# Patient Record
Sex: Male | Born: 1955 | Race: White | Hispanic: No | Marital: Married | Smoking: Former smoker
Health system: Southern US, Community
[De-identification: ages and names within clinical notes are randomized; demographics above are authoritative.]

## PROBLEM LIST (undated history)

## (undated) DIAGNOSIS — E119 Type 2 diabetes mellitus without complications: Secondary | ICD-10-CM

## (undated) DIAGNOSIS — F411 Generalized anxiety disorder: Secondary | ICD-10-CM

## (undated) DIAGNOSIS — E782 Mixed hyperlipidemia: Secondary | ICD-10-CM

## (undated) DIAGNOSIS — K219 Gastro-esophageal reflux disease without esophagitis: Secondary | ICD-10-CM

## (undated) DIAGNOSIS — B1920 Unspecified viral hepatitis C without hepatic coma: Secondary | ICD-10-CM

## (undated) DIAGNOSIS — J302 Other seasonal allergic rhinitis: Secondary | ICD-10-CM

## (undated) DIAGNOSIS — L959 Vasculitis limited to the skin, unspecified: Secondary | ICD-10-CM

## (undated) DIAGNOSIS — I776 Arteritis, unspecified: Secondary | ICD-10-CM

## (undated) DIAGNOSIS — E291 Testicular hypofunction: Secondary | ICD-10-CM

## (undated) HISTORY — DX: Arteritis, unspecified: I77.6

## (undated) HISTORY — DX: Other seasonal allergic rhinitis: J30.2

## (undated) HISTORY — DX: Generalized anxiety disorder: F41.1

## (undated) HISTORY — DX: Unspecified viral hepatitis C without hepatic coma: B19.20

## (undated) HISTORY — PX: LIVER BIOPSY: SHX301

## (undated) HISTORY — DX: Type 2 diabetes mellitus without complications: E11.9

## (undated) HISTORY — DX: Mixed hyperlipidemia: E78.2

## (undated) HISTORY — DX: Gastro-esophageal reflux disease without esophagitis: K21.9

## (undated) HISTORY — PX: ESOPHAGOGASTRODUODENOSCOPY: SHX1529

## (undated) HISTORY — DX: Testicular hypofunction: E29.1

## (undated) HISTORY — DX: Vasculitis limited to the skin, unspecified: L95.9

---

## 2007-05-12 ENCOUNTER — Ambulatory Visit: Payer: Self-pay | Admitting: Gastroenterology

## 2008-07-30 ENCOUNTER — Ambulatory Visit: Payer: Self-pay | Admitting: Family Medicine

## 2010-10-16 ENCOUNTER — Ambulatory Visit: Payer: Self-pay | Admitting: Gastroenterology

## 2011-04-09 ENCOUNTER — Ambulatory Visit: Payer: Self-pay | Admitting: Gastroenterology

## 2011-04-19 LAB — PATHOLOGY REPORT

## 2011-10-18 ENCOUNTER — Ambulatory Visit: Payer: Self-pay

## 2011-10-18 ENCOUNTER — Emergency Department: Payer: Self-pay | Admitting: Emergency Medicine

## 2012-11-04 IMAGING — US ULTRASOUND CORE BIOPSY
1 series · 6 of 6 positions shown · non-contrast
Comparison: none

REASON FOR EXAM: liver biopsy  hepatitis C
COMMENTS:

[Series 1: ultrasound core biopsy · 6 of 6 slices shown]
[im 1/6]
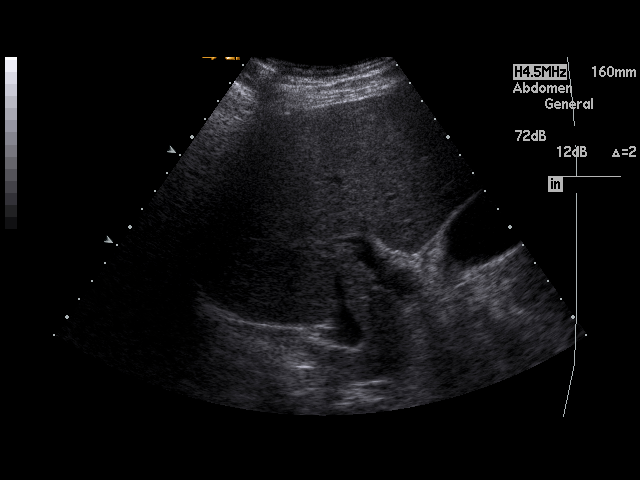
[im 2/6]
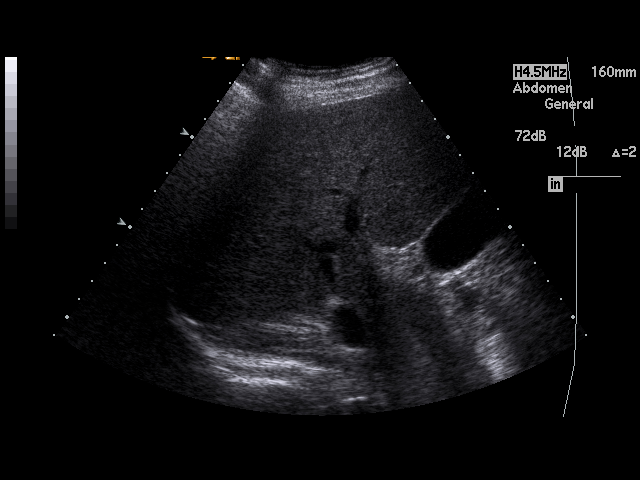
[im 3/6]
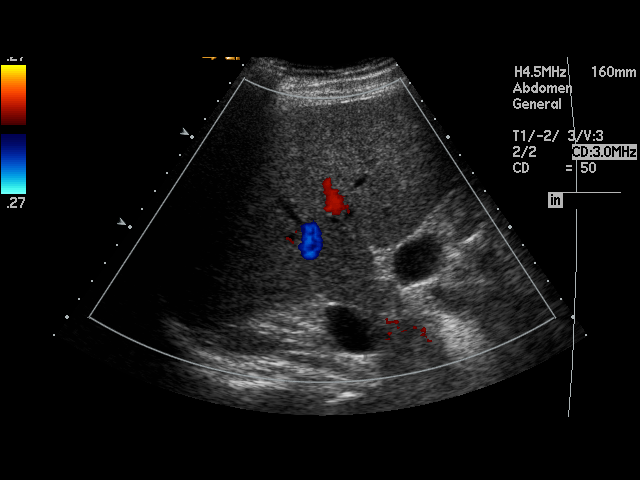
[im 4/6]
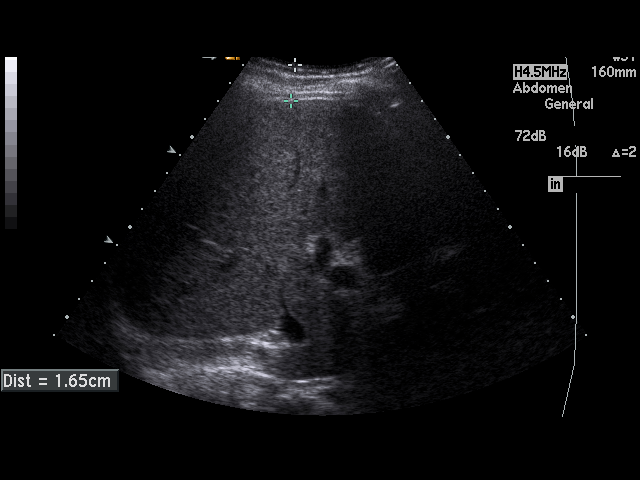
[im 5/6]
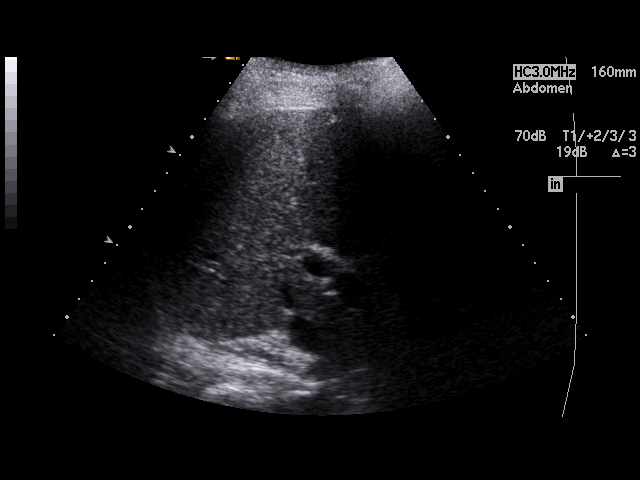
[im 6/6]
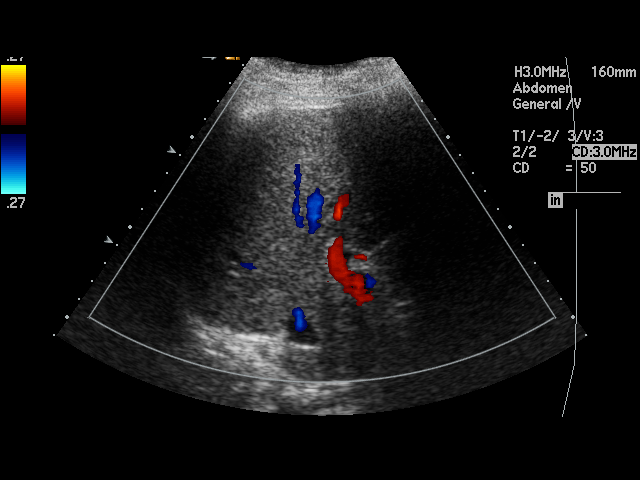

[6 of 6 positions shown; findings below may reference images not displayed]

PROCEDURE:     US  - US GUIDED BX/ASPIRATION NOT BR  - April 09, 2011  [DATE]

RESULT:     Indication: Hepatitis C.  Liver biopsy is requested.

Procedure:  Clinical assessment was performed and informed consent obtained.
A formal timeout procedure was performed according to departmental protocol.
The patient was brought to the sonography suite and positioned supine.  The
inferior margin of the liver at the midaxillary line was identified under
ultrasound and marked for biopsy. The site was prepped and draped in the
usual sterile manner.

Following adequate local anesthesia with 7 ml of 1% lidocaine, one 18 gauge
core needle Biopsy of the right hepatic lobe were performed under
sonographic guidance in end-expiration and submitted for pathology.  The
procedure was well tolerated and without complication.  The patient returned
to the recovery unit in stable condition in the right lateral decubitus
position.
IMPRESSION: Uncomplicated core needle liver biopsy.

## 2013-01-26 ENCOUNTER — Ambulatory Visit (HOSPITAL_COMMUNITY): Payer: Self-pay | Admitting: Psychiatry

## 2013-06-22 ENCOUNTER — Ambulatory Visit: Payer: Self-pay | Admitting: Urology

## 2021-10-08 ENCOUNTER — Other Ambulatory Visit: Payer: Self-pay | Admitting: Internal Medicine

## 2021-10-08 DIAGNOSIS — R1013 Epigastric pain: Secondary | ICD-10-CM

## 2021-10-08 DIAGNOSIS — R1112 Projectile vomiting: Secondary | ICD-10-CM

## 2021-10-14 ENCOUNTER — Ambulatory Visit
Admission: RE | Admit: 2021-10-14 | Discharge: 2021-10-14 | Disposition: A | Discharge status: Home / Self Care | Payer: Medicare PPO | Source: Ambulatory Visit | Attending: Internal Medicine | Admitting: Internal Medicine

## 2021-10-14 ENCOUNTER — Other Ambulatory Visit: Payer: Self-pay

## 2021-10-14 DIAGNOSIS — R1013 Epigastric pain: Secondary | ICD-10-CM | POA: Diagnosis present

## 2021-10-14 DIAGNOSIS — R1112 Projectile vomiting: Secondary | ICD-10-CM | POA: Diagnosis present

## 2021-11-02 ENCOUNTER — Ambulatory Visit: Admit: 2021-11-02 | Payer: Self-pay | Admitting: Internal Medicine

## 2021-11-02 SURGERY — EGD (ESOPHAGOGASTRODUODENOSCOPY)
Anesthesia: General

## 2022-11-17 ENCOUNTER — Other Ambulatory Visit: Payer: Self-pay | Admitting: Family Medicine

## 2022-11-17 DIAGNOSIS — E782 Mixed hyperlipidemia: Secondary | ICD-10-CM

## 2022-12-01 ENCOUNTER — Ambulatory Visit: Payer: Medicare PPO

## 2022-12-10 ENCOUNTER — Telehealth: Payer: Self-pay

## 2022-12-10 NOTE — Telephone Encounter (Signed)
Multiple calls made to patient and his  spouse for introductory phone call prior to his new patient appointment on 12/13/22 with Dr. Tasia Catchings. I received no answer or voicemail on multiple attempts.

## 2022-12-13 ENCOUNTER — Inpatient Hospital Stay: Payer: Medicare PPO

## 2022-12-13 ENCOUNTER — Inpatient Hospital Stay: Payer: Medicare PPO | Attending: Oncology | Admitting: Oncology

## 2022-12-13 ENCOUNTER — Encounter: Payer: Self-pay | Admitting: Oncology

## 2022-12-13 VITALS — BP 144/91 | HR 78 | Temp 97.2°F | Resp 18 | Ht 65.0 in | Wt 151.0 lb

## 2022-12-13 DIAGNOSIS — D751 Secondary polycythemia: Secondary | ICD-10-CM | POA: Insufficient documentation

## 2022-12-13 DIAGNOSIS — Z79899 Other long term (current) drug therapy: Secondary | ICD-10-CM | POA: Diagnosis not present

## 2022-12-13 DIAGNOSIS — Z803 Family history of malignant neoplasm of breast: Secondary | ICD-10-CM | POA: Insufficient documentation

## 2022-12-13 DIAGNOSIS — Z8051 Family history of malignant neoplasm of kidney: Secondary | ICD-10-CM | POA: Diagnosis not present

## 2022-12-13 DIAGNOSIS — Z8042 Family history of malignant neoplasm of prostate: Secondary | ICD-10-CM | POA: Insufficient documentation

## 2022-12-13 DIAGNOSIS — Z87891 Personal history of nicotine dependence: Secondary | ICD-10-CM | POA: Diagnosis not present

## 2022-12-13 LAB — CBC WITH DIFFERENTIAL/PLATELET
Abs Immature Granulocytes: 0.02 10*3/uL (ref 0.00–0.07)
Basophils Absolute: 0.1 10*3/uL (ref 0.0–0.1)
Basophils Relative: 1 %
Eosinophils Absolute: 0.2 10*3/uL (ref 0.0–0.5)
Eosinophils Relative: 2 %
HCT: 51.5 % (ref 39.0–52.0)
Hemoglobin: 16.9 g/dL (ref 13.0–17.0)
Immature Granulocytes: 0 %
Lymphocytes Relative: 15 %
Lymphs Abs: 1.2 10*3/uL (ref 0.7–4.0)
MCH: 29.1 pg (ref 26.0–34.0)
MCHC: 32.8 g/dL (ref 30.0–36.0)
MCV: 88.8 fL (ref 80.0–100.0)
Monocytes Absolute: 0.7 10*3/uL (ref 0.1–1.0)
Monocytes Relative: 9 %
Neutro Abs: 5.9 10*3/uL (ref 1.7–7.7)
Neutrophils Relative %: 73 %
Platelets: 168 10*3/uL (ref 150–400)
RBC: 5.8 MIL/uL (ref 4.22–5.81)
RDW: 12.7 % (ref 11.5–15.5)
WBC: 8.1 10*3/uL (ref 4.0–10.5)
nRBC: 0 % (ref 0.0–0.2)

## 2022-12-13 NOTE — Progress Notes (Signed)
Hematology/Oncology Consult note [phone] [phone]        REFERRING PROVIDER: Lonia Farber, MD   CHIEF COMPLAINTS/REASON FOR VISIT:  Evaluation of erythrocytosis   ASSESSMENT & PLAN:   Erythrocytosis Labs are reviewed and discussed with patient. Erythrocytosis is an abnormal elevation of hemoglobin (Hgb) and/or hematocrit (Hct) in peripheral blood, and this can be caused by primary etiology, ie myeloproliferative disease, or secondary etiology, ie testosterone use, sleep apnea, smoking, etc  or familiar condition.  Patient does not smoke.  Previous workup showed negative for sleep apnea.  Most likely secondary to testosterone replacement therapy. I will obtain erythropoietin, carbo monoxide level, rule out primary etiology, JAK2 with reflex to other mutations, BCR-ABL1 FISH.    Today CBC showed normal hemoglobin and hematocrit.   Orders Placed This Encounter  Procedures   CBC with Differential/Platelet    Standing Status:   Future    Number of Occurrences:   1    Standing Expiration Date:   12/13/2023   JAK2 V617F rfx CALR/MPL/E12-15    Standing Status:   Future    Number of Occurrences:   1    Standing Expiration Date:   12/13/2023   BCR-ABL1 FISH    Standing Status:   Future    Number of Occurrences:   1    Standing Expiration Date:   12/13/2023   Follow-up to be determined.  Depending on workup results. All questions were answered. The patient knows to call the clinic with any problems, questions or concerns.  Earlie Server, MD, PhD [facility] Hematology Oncology 12/13/2022   HISTORY OF PRESENTING ILLNESS:   Eddie Welch is a  67 y.o.  male with PMH listed below was seen in consultation at the request of  Lonia Farber, MD  for evaluation of erythrocytosis.  11/15/2022, patient CBC done which showed a hemoglobin of 18.2, hematocrit 53.6.  Patient is on testosterone replacement therapy, managed by endocrinology Dr. Honor Junes.  With  holding testosterone, patient had a repeat CBC done on 11/22/2022, and had a CBC of 16.7, hematocrit 50.5. Patient reports having being on testosterone for a long time.  He had sleep study done in the past and he does not have sleep apnea.  Denies any unintentional weight loss, night sweats, fever.  Patient is a former smoker.  MEDICAL HISTORY:  Past Medical History:  Diagnosis Date   DM2 (diabetes mellitus, type 2) (HCC)    Generalized anxiety disorder    GERD (gastroesophageal reflux disease)    Hepatitis C    Hypogonadism male    Mixed hyperlipidemia    Seasonal allergies    Vasculitis on skin biopsy ([place])     SURGICAL HISTORY: Past Surgical History:  Procedure Laterality Date   ESOPHAGOGASTRODUODENOSCOPY     LIVER BIOPSY      SOCIAL HISTORY: Social History   Socioeconomic History   Marital status: Married    Spouse name: Not on file   Number of children: Not on file   Years of education: Not on file   Highest education level: Not on file  Occupational History   Not on file  Tobacco Use   Smoking status: Former    Packs/day: 1.00    Years: 15.00    Additional pack years: 0.00    Total pack years: 15.00    Types: Cigarettes    Quit date: 2019    Years since quitting: 5.2   Smokeless tobacco: Never  Vaping Use   Vaping  Use: Never used  Substance and Sexual Activity   Alcohol use: Not Currently    Comment: once in a while   Drug use: Not on file   Sexual activity: Not on file  Other Topics Concern   Not on file  Social History Narrative   Not on file   Social Determinants of Health   Financial Resource Strain: Not on file  Food Insecurity: No Food Insecurity (12/13/2022)   Hunger Vital Sign    Worried About Running Out of Food in the Last Year: Never true    Ran Out of Food in the Last Year: Never true  Transportation Needs: No Transportation Needs (12/13/2022)   PRAPARE - Hydrologist (Medical): No    Lack of Transportation  (Non-Medical): No  Physical Activity: Not on file  Stress: Not on file  Social Connections: Not on file  Intimate Partner Violence: Not At Risk (12/13/2022)   Humiliation, Afraid, Rape, and Kick questionnaire    Fear of Current or Ex-Partner: No    Emotionally Abused: No    Physically Abused: No    Sexually Abused: No    FAMILY HISTORY: Family History  Problem Relation Age of Onset   Hypertension Mother    Breast cancer Mother    Diabetes Father    CAD Father    Heart attack Father    Thyroid disease Father    Cancer Brother    Bipolar disorder Brother    Alcohol abuse Brother    Stroke Maternal Grandfather    Diabetes Paternal Grandmother    Prostate cancer Paternal Grandfather    Kidney cancer Paternal Grandfather     ALLERGIES:  has no allergies on file.  MEDICATIONS:  Current Outpatient Medications  Medication Sig Dispense Refill   buPROPion (WELLBUTRIN XL) 150 MG 24 hr tablet Take 150 mg by mouth daily.     Dulaglutide 3 MG/0.5ML SOPN Inject into the skin. Inject 0.5 mLs (3 mg total) subcutaneously once a week     empagliflozin (JARDIANCE) 25 MG TABS tablet Take 25 mg by mouth daily.     Insulin Aspart FlexPen (NOVOLOG) 100 UNIT/ML 3 units with meals that have carb. Add sliding scale as necessary. Max daily dose 50 units.     lisinopril (ZESTRIL) 5 MG tablet Take 5 mg by mouth daily.     metFORMIN (GLUCOPHAGE) 1000 MG tablet Take 1,000 mg by mouth 2 (two) times daily with a meal.     Multiple Vitamin (MULTIVITAMIN) capsule Take 1 capsule by mouth daily.     pantoprazole (PROTONIX) 40 MG tablet Take 40 mg by mouth daily.     rosuvastatin (CRESTOR) 20 MG tablet Take by mouth. Take 1 tablet (20 mg total) by mouth once daily     sildenafil (REVATIO) 20 MG tablet MAY USE 1 TO 5 TABLETS DAILY 1/2 AN HOURBEFORE INTERCOURSE     sitaGLIPtin (JANUVIA) 100 MG tablet Take 100 mg by mouth daily.     testosterone cypionate (DEPOTESTOSTERONE CYPIONATE) 200 MG/ML injection INJECT  0.5 ML INTO THE MUSCLE EVERY 7 DAYS (SINGLE USE ONLY VIAL)     TRESIBA FLEXTOUCH 100 UNIT/ML FlexTouch Pen INJECT 22 UNITS UNDER THE SKIN EVERY MORNING; TITRATE TO GET FASTING SUGAR BELOW 130 AS DIRECTED (MAX DAILY DOSE: 50 UNITS)     No current facility-administered medications for this visit.    Review of Systems  Constitutional:  Negative for appetite change, chills, fatigue, fever and unexpected weight change.  HENT:  Negative for hearing loss and voice change.   Eyes:  Negative for eye problems and icterus.  Respiratory:  Negative for chest tightness, cough and shortness of breath.   Cardiovascular:  Negative for chest pain and leg swelling.  Gastrointestinal:  Negative for abdominal distention and abdominal pain.  Endocrine: Negative for hot flashes.  Genitourinary:  Negative for difficulty urinating, dysuria and frequency.   Musculoskeletal:  Negative for arthralgias.  Skin:  Negative for itching and rash.  Neurological:  Negative for light-headedness and numbness.  Hematological:  Negative for adenopathy. Does not bruise/bleed easily.  Psychiatric/Behavioral:  Negative for confusion.    PHYSICAL EXAMINATION: ECOG PERFORMANCE STATUS: 0 - Asymptomatic Vitals:   12/13/22 0940  BP: (!) 144/91  Pulse: 78  Resp: 18  Temp: (!) 97.2 F (36.2 C)   Filed Weights   12/13/22 0940  Weight: 151 lb (68.5 kg)    Physical Exam Constitutional:      General: He is not in acute distress. HENT:     Head: Normocephalic and atraumatic.  Eyes:     General: No scleral icterus. Cardiovascular:     Rate and Rhythm: Normal rate and regular rhythm.     Heart sounds: Normal heart sounds.  Pulmonary:     Effort: Pulmonary effort is normal. No respiratory distress.     Breath sounds: No wheezing.  Abdominal:     General: Bowel sounds are normal. There is no distension.     Palpations: Abdomen is soft.  Musculoskeletal:        General: No deformity. Normal range of motion.     Cervical  back: Normal range of motion and neck supple.  Skin:    General: Skin is warm and dry.     Findings: No erythema or rash.  Neurological:     Mental Status: He is alert and oriented to person, place, and time. Mental status is at baseline.     Cranial Nerves: No cranial nerve deficit.     Coordination: Coordination normal.  Psychiatric:        Mood and Affect: Mood normal.     LABORATORY DATA:  I have reviewed the data as listed    Latest Ref Rng & Units 12/13/2022   10:07 AM  CBC  WBC 4.0 - 10.5 K/uL 8.1   Hemoglobin 13.0 - 17.0 g/dL 16.9   Hematocrit 39.0 - 52.0 % 51.5   Platelets 150 - 400 K/uL 168        No data to display            RADIOGRAPHIC STUDIES: I have personally reviewed the radiological images as listed and agreed with the findings in the report. No results found.

## 2022-12-13 NOTE — Progress Notes (Signed)
Pt here to establish care for polycythemia.  

## 2022-12-13 NOTE — Assessment & Plan Note (Addendum)
Labs are reviewed and discussed with patient. Erythrocytosis is an abnormal elevation of hemoglobin (Hgb) and/or hematocrit (Hct) in peripheral blood, and this can be caused by primary etiology, ie myeloproliferative disease, or secondary etiology, ie testosterone use, sleep apnea, smoking, etc  or familiar condition.  Patient does not smoke.  Previous workup showed negative for sleep apnea.  Most likely secondary to testosterone replacement therapy. I will obtain erythropoietin, carbo monoxide level, rule out primary etiology, JAK2 with reflex to other mutations, BCR-ABL1 FISH.    Today CBC showed normal hemoglobin and hematocrit.-No need for urgent phlebotomy at this point.

## 2022-12-15 ENCOUNTER — Ambulatory Visit
Admission: RE | Admit: 2022-12-15 | Discharge: 2022-12-15 | Disposition: A | Discharge status: Home / Self Care | Payer: Medicare PPO | Source: Ambulatory Visit | Attending: Family Medicine | Admitting: Family Medicine

## 2022-12-15 DIAGNOSIS — E782 Mixed hyperlipidemia: Secondary | ICD-10-CM | POA: Diagnosis present

## 2022-12-15 DIAGNOSIS — Z136 Encounter for screening for cardiovascular disorders: Secondary | ICD-10-CM | POA: Diagnosis not present

## 2022-12-17 LAB — BCR-ABL1 FISH
Cells Analyzed: 200
Cells Counted: 200

## 2022-12-22 LAB — JAK2 V617F RFX CALR/MPL/E12-15

## 2022-12-22 LAB — CALR +MPL + E12-E15  (REFLEX)

## 2023-01-03 ENCOUNTER — Telehealth: Payer: Self-pay

## 2023-01-03 DIAGNOSIS — D751 Secondary polycythemia: Secondary | ICD-10-CM

## 2023-01-03 NOTE — Telephone Encounter (Signed)
Pt informed of MD recommendation and follow up plan:   Please schedule and inform pt of appt:  Lab/MD/ Phleb (cbc) in 6 months

## 2023-01-03 NOTE — Telephone Encounter (Signed)
-----   Message from Rickard Patience, MD sent at 01/01/2023  5:20 PM EDT ----- Please let patient know there his blood work up results are good.  Previous high blood count is likely due to testosterone use. Current his blood level is normal. No need to remove blood currently.  If he plans to stay on testosterone replacement, I recommend him to follow up in 6 months lab MD- phleb. Please order cbc thanks.

## 2023-01-12 ENCOUNTER — Encounter: Payer: Self-pay | Admitting: Family Medicine

## 2023-04-12 ENCOUNTER — Ambulatory Visit: Payer: Medicare PPO

## 2023-04-12 DIAGNOSIS — K640 First degree hemorrhoids: Secondary | ICD-10-CM

## 2023-04-12 DIAGNOSIS — D128 Benign neoplasm of rectum: Secondary | ICD-10-CM

## 2023-04-12 DIAGNOSIS — Z1211 Encounter for screening for malignant neoplasm of colon: Secondary | ICD-10-CM

## 2023-05-12 IMAGING — US US ABDOMEN LIMITED
1 series · 14 of 25 positions shown · non-contrast
Comparison: Right upper quadrant abdominal ultrasound 10/16/2010

CLINICAL DATA: Epigastric pain, nausea, and vomiting for 1 month.

EXAM:
ULTRASOUND ABDOMEN LIMITED RIGHT UPPER QUADRANT

[Series 1: us abdomen limited · 0.22mm/px · 14 of 52 slices shown]
[im 1/52]
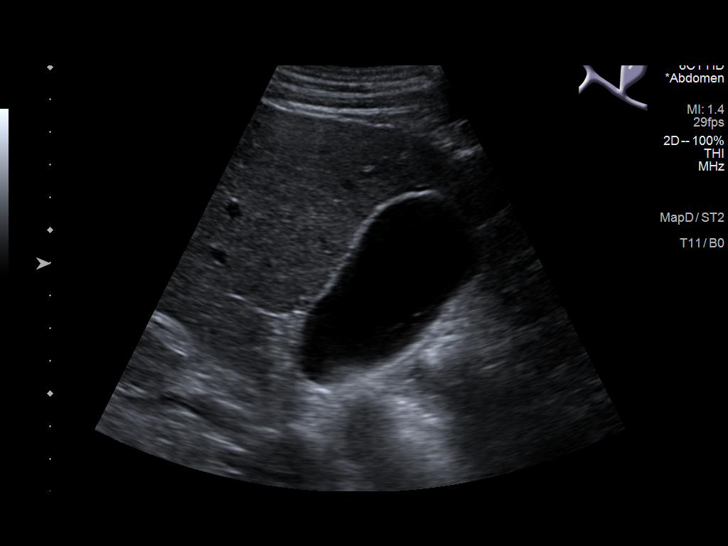
[im 5/52]
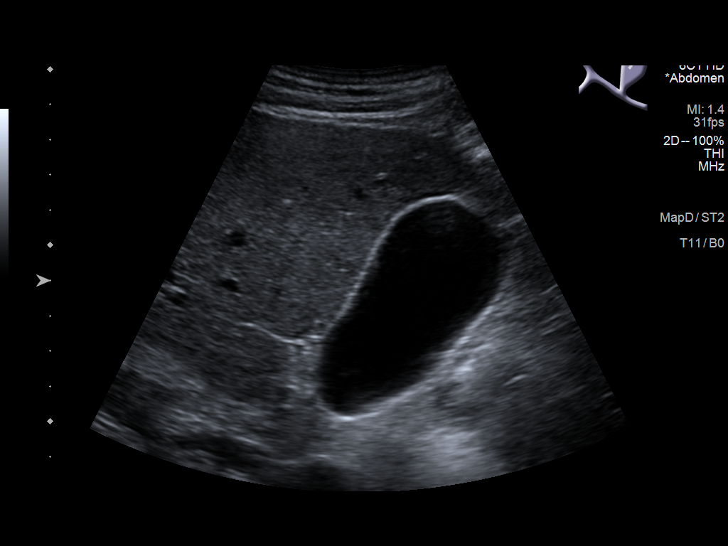
[im 9/52]
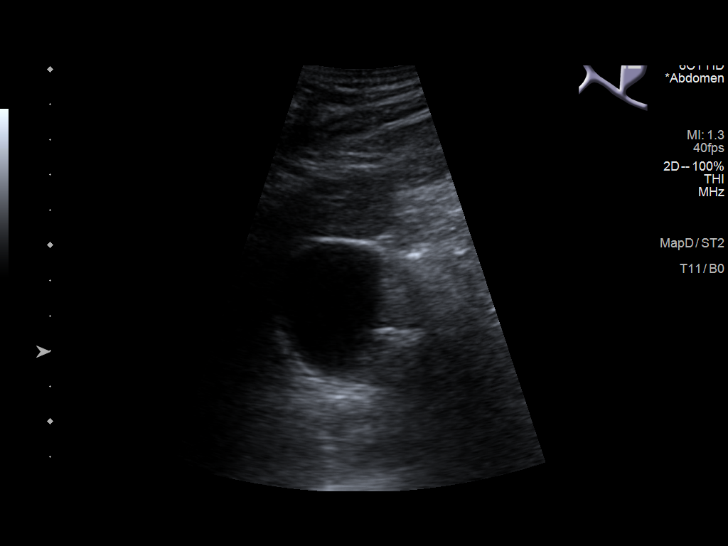
[im 13/52]
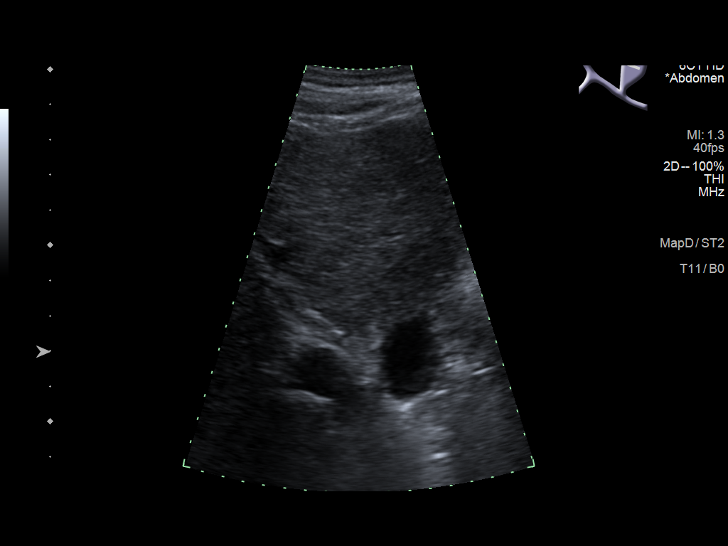
[im 18/52]
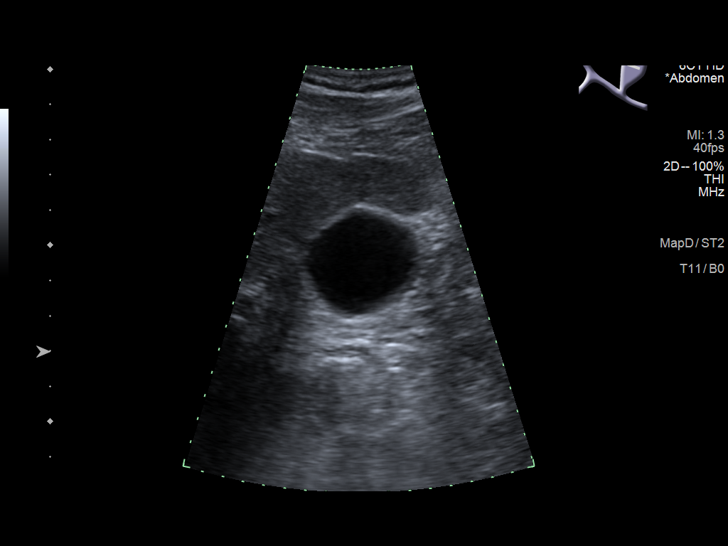
[im 20/52]
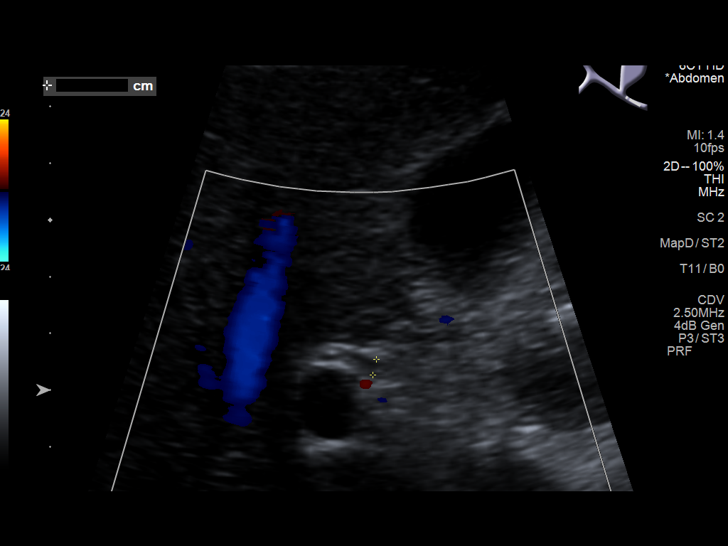
[im 24/52]
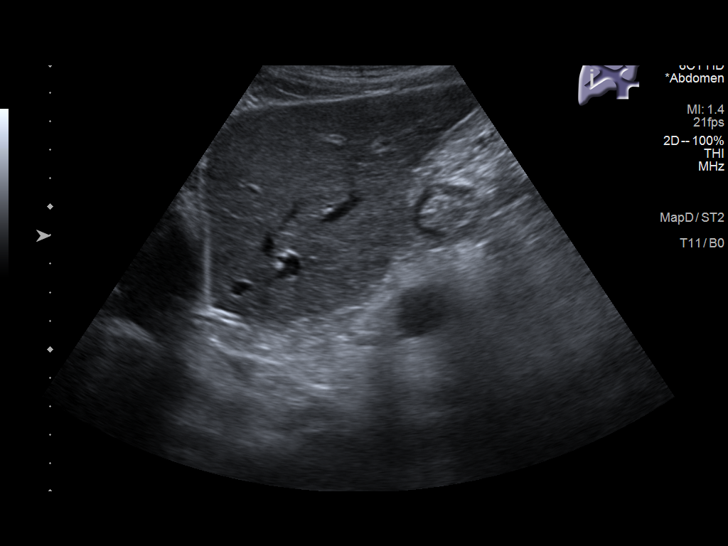
[im 28/52]
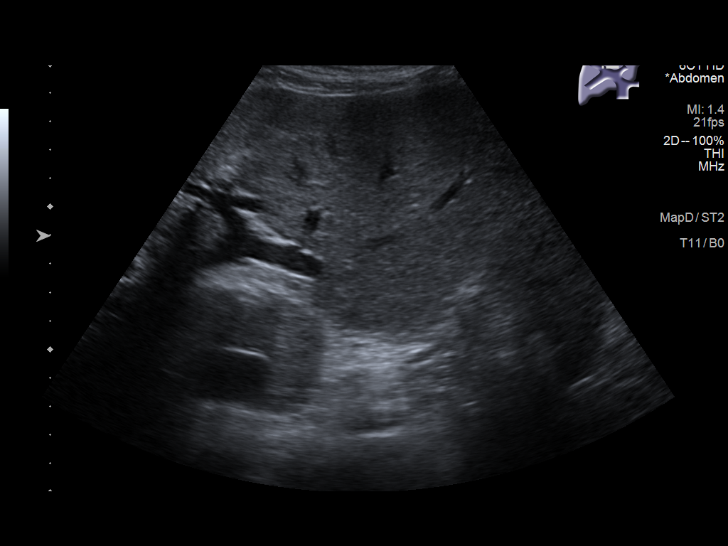
[im 32/52]
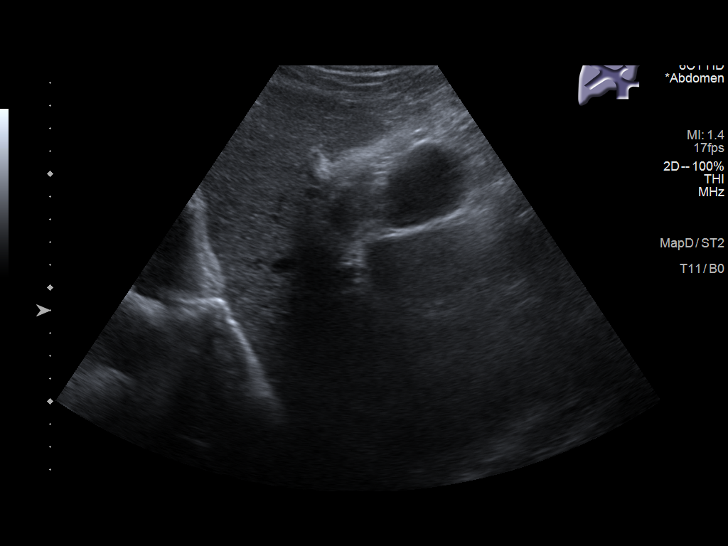
[im 35/52]
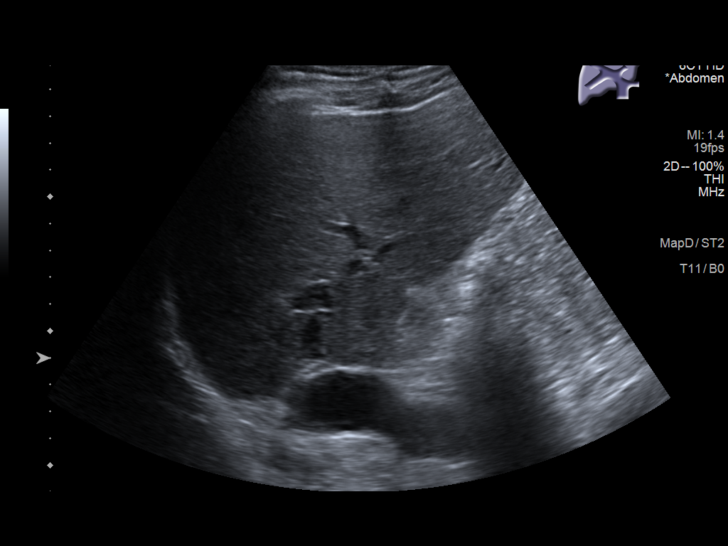
[im 39/52]
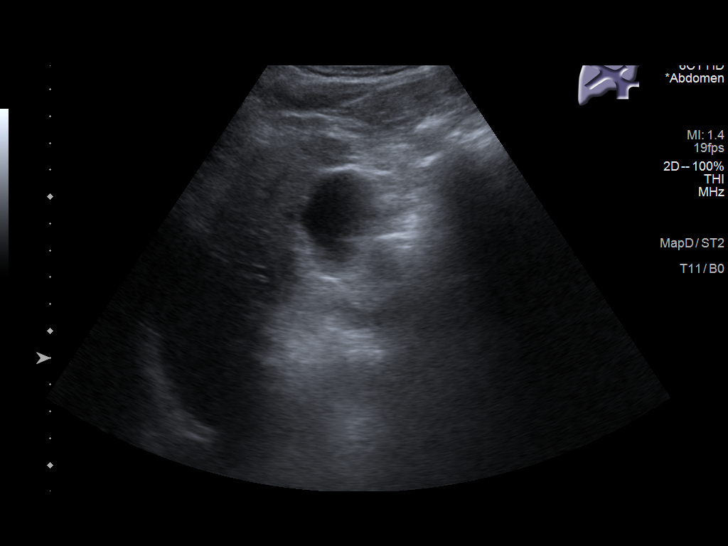
[im 43/52]
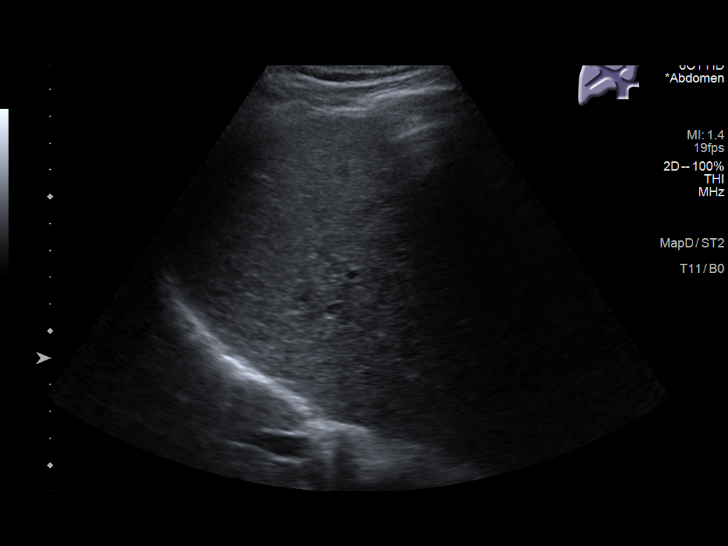
[im 47/52]
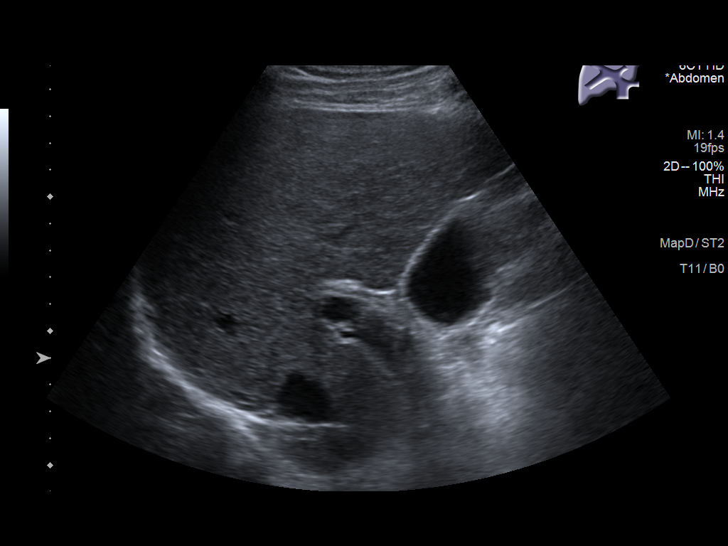
[im 52/52]
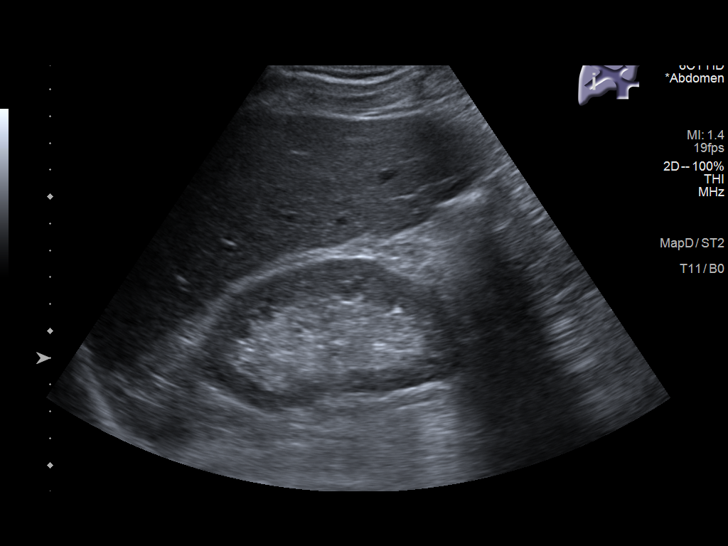

[14 of 25 positions shown; findings below may reference images not displayed]

FINDINGS: Gallbladder:

No gallstones or wall thickening visualized. Mild echogenic
nonshadowing gallbladder sludge. No pericholecystic fluid. No
sonographic Murphy sign noted by sonographer.

Common bile duct:

Diameter: 3 mm

Liver:

No focal lesion identified. Within normal limits in parenchymal
echogenicity. Portal vein is patent on color Doppler imaging with
normal direction of blood flow towards the liver.

Other: None.
IMPRESSION: Mild gallbladder sludge.  No sonographic evidence of cholecystitis.

## 2023-07-04 ENCOUNTER — Ambulatory Visit (INDEPENDENT_AMBULATORY_CARE_PROVIDER_SITE_OTHER): Payer: Medicare PPO

## 2023-07-04 ENCOUNTER — Ambulatory Visit (INDEPENDENT_AMBULATORY_CARE_PROVIDER_SITE_OTHER): Payer: Self-pay | Admitting: Nurse Practitioner

## 2023-07-04 ENCOUNTER — Other Ambulatory Visit (INDEPENDENT_AMBULATORY_CARE_PROVIDER_SITE_OTHER): Payer: Self-pay | Admitting: Nurse Practitioner

## 2023-07-04 ENCOUNTER — Encounter (INDEPENDENT_AMBULATORY_CARE_PROVIDER_SITE_OTHER): Payer: Self-pay | Admitting: Nurse Practitioner

## 2023-07-04 VITALS — BP 150/81 | HR 86 | Resp 16 | Ht 66.0 in | Wt 144.8 lb

## 2023-07-04 DIAGNOSIS — R6889 Other general symptoms and signs: Secondary | ICD-10-CM

## 2023-07-04 DIAGNOSIS — R200 Anesthesia of skin: Secondary | ICD-10-CM

## 2023-07-04 NOTE — Progress Notes (Signed)
Subjective:    Patient ID: Eddie Welch, male    DOB: 14-Oct-1955, 67 y.o.   MRN: [id number] Chief Complaint  Patient presents with   New Patient (Initial Visit)    Ref Olmedo consult abnormal PAD screening left side numbness    Eddie Welch is a 67 year old male who presents today for evaluation of a abnormal ABI done by his home health screening.  He denies any claudication-like symptoms or rest pain or ulcerations.  He has some numbness in his left arm and sometimes his face but this randomly comes and goes.  He denies any significant pain.  He denies any numbness of his left lower extremity.  The patient has an ABI of 1.28 on the right and 1.14 on the left.  He has a TBI 0.4 on the right and 0.94 on the left.  He has strong triphasic tibial artery waveforms bilaterally with good toe waveforms bilaterally    Review of Systems  All other systems reviewed and are negative.      Objective:   Physical Exam Vitals reviewed.  HENT:     Head: Normocephalic.  Cardiovascular:     Rate and Rhythm: Normal rate.     Pulses: Normal pulses.  Pulmonary:     Effort: Pulmonary effort is normal.  Skin:    General: Skin is warm and dry.  Neurological:     Mental Status: He is alert and oriented to person, place, and time.  Psychiatric:        Mood and Affect: Mood normal.        Behavior: Behavior normal.        Thought Content: Thought content normal.        Judgment: Judgment normal.     BP (!) 150/81 (BP Location: Left Arm)   Pulse 86   Resp 16   Ht 5\' 6"  (1.676 m)   Wt 144 lb 12.8 oz (65.7 kg)   BMI 23.37 kg/m   Past Medical History:  Diagnosis Date   DM2 (diabetes mellitus, type 2) (HCC)    Generalized anxiety disorder    GERD (gastroesophageal reflux disease)    Hepatitis C    Hypogonadism male    Mixed hyperlipidemia    Seasonal allergies    Vasculitis on skin biopsy     Social History   Socioeconomic History   Marital status: Married    Spouse name: Not on  file   Number of children: Not on file   Years of education: Not on file   Highest education level: Not on file  Occupational History   Not on file  Tobacco Use   Smoking status: Former    Current packs/day: 0.00    Average packs/day: 1 pack/day for 15.0 years (15.0 ttl pk-yrs)    Types: Cigarettes    Start date: 2004    Quit date: 2019    Years since quitting: 5.7   Smokeless tobacco: Never  Vaping Use   Vaping status: Never Used  Substance and Sexual Activity   Alcohol use: Not Currently    Comment: once in a while   Drug use: Not on file   Sexual activity: Not on file  Other Topics Concern   Not on file  Social History Narrative   Not on file   Social Determinants of Health   Financial Resource Strain: Low Risk  (11/10/2022)   Received from [facility], [facility]   Overall Financial Resource Strain (CARDIA)  Difficulty of Paying Living Expenses: Not very hard  Food Insecurity: No Food Insecurity (12/13/2022)   Hunger Vital Sign    Worried About Running Out of Food in the Last Year: Never true    Ran Out of Food in the Last Year: Never true  Transportation Needs: No Transportation Needs (12/13/2022)   PRAPARE - Administrator, Civil Service (Medical): No    Lack of Transportation (Non-Medical): No  Physical Activity: Not on file  Stress: Not on file  Social Connections: Not on file  Intimate Partner Violence: Not At Risk (12/13/2022)   Humiliation, Afraid, Rape, and Kick questionnaire    Fear of Current or Ex-Partner: No    Emotionally Abused: No    Physically Abused: No    Sexually Abused: No    Past Surgical History:  Procedure Laterality Date   ESOPHAGOGASTRODUODENOSCOPY     LIVER BIOPSY      Family History  Problem Relation Age of Onset   Hypertension Mother    Breast cancer Mother    Diabetes Father    CAD Father    Heart attack Father    Thyroid disease Father    Cancer Brother    Bipolar  disorder Brother    Alcohol abuse Brother    Stroke Maternal Grandfather    Diabetes Paternal Grandmother    Prostate cancer Paternal Grandfather    Kidney cancer Paternal Grandfather     No Known Allergies     Latest Ref Rng & Units 12/13/2022   10:07 AM  CBC  WBC 4.0 - 10.5 K/uL 8.1   Hemoglobin 13.0 - 17.0 g/dL 82.9   Hematocrit 56.2 - 52.0 % 51.5   Platelets 150 - 400 K/uL 168       CMP  No results found for: "NA", "K", "CL", "CO2", "GLUCOSE", "BUN", "CREATININE", "CALCIUM", "PROT", "ALBUMIN", "AST", "ALT", "ALKPHOS", "BILITOT", "GFR", "EGFR", "GFRNONAA"   No results found.     Assessment & Plan:   1. Abnormal ankle brachial index (ABI) Today patient's noninvasive studies show no evidence of peripheral arterial disease.  Patient has strong palpable pulses bilaterally with normal ABIs bilaterally.  No further workup required.  No further intervention necessary.  2. Left arm numbness The patient has left arm numbness but no left leg numbness.  Based on this I suspect it may be carpal tunnel syndrome versus spine.  Patient will defer to PCP for further workup and evaluation   Current Outpatient Medications on File Prior to Visit  Medication Sig Dispense Refill   buPROPion (WELLBUTRIN XL) 150 MG 24 hr tablet Take 150 mg by mouth daily.     Dulaglutide 3 MG/0.5ML SOPN Inject into the skin. Inject 0.5 mLs (3 mg total) subcutaneously once a week     empagliflozin (JARDIANCE) 25 MG TABS tablet Take 25 mg by mouth daily.     Insulin Aspart FlexPen (NOVOLOG) 100 UNIT/ML 3 units with meals that have carb. Add sliding scale as necessary. Max daily dose 50 units.     Multiple Vitamin (MULTIVITAMIN) capsule Take 1 capsule by mouth daily.     pantoprazole (PROTONIX) 40 MG tablet Take 40 mg by mouth daily.     rosuvastatin (CRESTOR) 20 MG tablet Take by mouth. Take 1 tablet (20 mg total) by mouth once daily     sildenafil (REVATIO) 20 MG tablet MAY USE 1 TO 5 TABLETS DAILY 1/2 AN  HOURBEFORE INTERCOURSE     sitaGLIPtin (JANUVIA) 100 MG tablet Take 100 mg  by mouth daily.     testosterone cypionate (DEPOTESTOSTERONE CYPIONATE) 200 MG/ML injection INJECT 0.5 ML INTO THE MUSCLE EVERY 7 DAYS (SINGLE USE ONLY VIAL)     TRESIBA FLEXTOUCH 100 UNIT/ML FlexTouch Pen INJECT 22 UNITS UNDER THE SKIN EVERY MORNING; TITRATE TO GET FASTING SUGAR BELOW 130 AS DIRECTED (MAX DAILY DOSE: 50 UNITS)     lisinopril (ZESTRIL) 5 MG tablet Take 5 mg by mouth daily.     metFORMIN (GLUCOPHAGE) 1000 MG tablet Take 1,000 mg by mouth 2 (two) times daily with a meal.     No current facility-administered medications on file prior to visit.    There are no Patient Instructions on file for this visit. No follow-ups on file.   Georgiana Spinner, NP

## 2023-07-05 ENCOUNTER — Inpatient Hospital Stay: Payer: Medicare PPO

## 2023-07-05 ENCOUNTER — Inpatient Hospital Stay: Payer: Medicare PPO | Attending: Oncology

## 2023-07-05 ENCOUNTER — Encounter: Payer: Self-pay | Admitting: Oncology

## 2023-07-05 ENCOUNTER — Inpatient Hospital Stay (HOSPITAL_BASED_OUTPATIENT_CLINIC_OR_DEPARTMENT_OTHER): Payer: Medicare PPO | Admitting: Oncology

## 2023-07-05 VITALS — BP 120/84 | HR 78

## 2023-07-05 VITALS — BP 125/71 | HR 79 | Temp 98.1°F | Resp 18 | Wt 144.8 lb

## 2023-07-05 DIAGNOSIS — D751 Secondary polycythemia: Secondary | ICD-10-CM | POA: Insufficient documentation

## 2023-07-05 LAB — CBC WITH DIFFERENTIAL (CANCER CENTER ONLY)
Abs Immature Granulocytes: 0.04 10*3/uL (ref 0.00–0.07)
Basophils Absolute: 0.1 10*3/uL (ref 0.0–0.1)
Basophils Relative: 1 %
Eosinophils Absolute: 0.1 10*3/uL (ref 0.0–0.5)
Eosinophils Relative: 1 %
HCT: 53.2 % — ABNORMAL HIGH (ref 39.0–52.0)
Hemoglobin: 17.9 g/dL — ABNORMAL HIGH (ref 13.0–17.0)
Immature Granulocytes: 0 %
Lymphocytes Relative: 9 %
Lymphs Abs: 0.9 10*3/uL (ref 0.7–4.0)
MCH: 28.9 pg (ref 26.0–34.0)
MCHC: 33.6 g/dL (ref 30.0–36.0)
MCV: 85.9 fL (ref 80.0–100.0)
Monocytes Absolute: 0.8 10*3/uL (ref 0.1–1.0)
Monocytes Relative: 8 %
Neutro Abs: 8.1 10*3/uL — ABNORMAL HIGH (ref 1.7–7.7)
Neutrophils Relative %: 81 %
Platelet Count: 180 10*3/uL (ref 150–400)
RBC: 6.19 MIL/uL — ABNORMAL HIGH (ref 4.22–5.81)
RDW: 13.2 % (ref 11.5–15.5)
WBC Count: 9.9 10*3/uL (ref 4.0–10.5)
nRBC: 0 % (ref 0.0–0.2)

## 2023-07-05 NOTE — Patient Instructions (Signed)

## 2023-07-05 NOTE — Progress Notes (Signed)
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   Previous workup showed JAK2 V617F mutation negative, with reflex to other mutations CALR, MPL, JAK 2 Ex 12-15 mutations negative. Negative BCR-ABL1 FISH, less likely primary etiology. Today's labs showed hematocrit above 50.  Recommend phlebotomy 500 cc x 1 today repeat another phlebotomy session next week.   Orders Placed This Encounter  Procedures   CBC with Differential (Cancer Center Only)    Standing Status:   Future    Standing Expiration Date:   07/04/2024   Follow-up 3 months All questions were answered. The patient knows to call the clinic with any problems, questions or concerns.  Rickard Patience, MD, PhD [facility] Hematology Oncology 07/05/2023   HISTORY OF PRESENTING ILLNESS:   Eddie Welch is a  67 y.o.  male with PMH listed below was seen in consultation at the request of  Dione Housekeeper, *  for evaluation of erythrocytosis.  11/15/2022, patient CBC done which showed a hemoglobin of 18.2, hematocrit 53.6.  Patient is on testosterone replacement therapy, managed by endocrinology Dr. Gershon Crane.  With holding testosterone, patient had a repeat CBC done on 11/22/2022, and had a CBC of 16.7, hematocrit 50.5. Patient reports having being on testosterone for a long time.  He had sleep study done in the past and he does not have sleep apnea.  Denies any unintentional weight loss, night sweats, fever.  Patient is a former smoker.   INTERVAL HISTORY Eddie Welch is a 67 y.o. 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 Currently he is on chronic testosterone replacement .  No new  complaints.     MEDICAL HISTORY:  Past Medical History:  Diagnosis Date   DM2 (diabetes mellitus, type 2) (HCC)    Generalized anxiety disorder    GERD (gastroesophageal reflux disease)    Hepatitis C    Hypogonadism male    Mixed hyperlipidemia    Seasonal allergies    Vasculitis on skin biopsy     SURGICAL HISTORY: Past Surgical History:  Procedure Laterality Date   ESOPHAGOGASTRODUODENOSCOPY     LIVER BIOPSY      SOCIAL HISTORY: Social History   Socioeconomic History   Marital status: Married    Spouse name: Not on file   Number of children: Not on file   Years of education: Not on file   Highest education level: Not on file  Occupational History   Not on file  Tobacco Use   Smoking status: Former    Current packs/day: 0.00    Average packs/day: 1 pack/day for 15.0 years (15.0 ttl pk-yrs)    Types: Cigarettes    Start date: 2004    Quit date: 2019    Years since quitting: 5.7   Smokeless tobacco: Never  Vaping Use   Vaping status: Never Used  Substance and Sexual Activity   Alcohol use: Not Currently    Comment: once in a while   Drug use: Not on file   Sexual activity: Not on file  Other Topics Concern   Not on file  Social History Narrative   Not on file   Social Determinants of Health   Financial Resource Strain: Low Risk  (  11/10/2022)   Received from [facility], [facility]   Overall Financial Resource Strain (CARDIA)    Difficulty of Paying Living Expenses: Not very hard  Food Insecurity: No Food Insecurity (12/13/2022)   Hunger Vital Sign    Worried About Running Out of Food in the Last Year: Never true    Ran Out of Food in the Last Year: Never true  Transportation Needs: No Transportation Needs (12/13/2022)   PRAPARE - Administrator, Civil Service (Medical): No    Lack of Transportation (Non-Medical): No  Physical Activity: Not on file  Stress: Not on file  Social Connections: Not on file   Intimate Partner Violence: Not At Risk (12/13/2022)   Humiliation, Afraid, Rape, and Kick questionnaire    Fear of Current or Ex-Partner: No    Emotionally Abused: No    Physically Abused: No    Sexually Abused: No    FAMILY HISTORY: Family History  Problem Relation Age of Onset   Hypertension Mother    Breast cancer Mother    Diabetes Father    CAD Father    Heart attack Father    Thyroid disease Father    Cancer Brother    Bipolar disorder Brother    Alcohol abuse Brother    Stroke Maternal Grandfather    Diabetes Paternal Grandmother    Prostate cancer Paternal Grandfather    Kidney cancer Paternal Grandfather     ALLERGIES:  has No Known Allergies.  MEDICATIONS:  Current Outpatient Medications  Medication Sig Dispense Refill   buPROPion (WELLBUTRIN XL) 150 MG 24 hr tablet Take 150 mg by mouth daily.     Dulaglutide 3 MG/0.5ML SOPN Inject into the skin. Inject 0.5 mLs (3 mg total) subcutaneously once a week     empagliflozin (JARDIANCE) 25 MG TABS tablet Take 25 mg by mouth daily.     Insulin Aspart FlexPen (NOVOLOG) 100 UNIT/ML 3 units with meals that have carb. Add sliding scale as necessary. Max daily dose 50 units.     lisinopril (ZESTRIL) 5 MG tablet Take 5 mg by mouth daily.     metFORMIN (GLUCOPHAGE) 1000 MG tablet Take 1,000 mg by mouth 2 (two) times daily with a meal.     Multiple Vitamin (MULTIVITAMIN) capsule Take 1 capsule by mouth daily.     pantoprazole (PROTONIX) 40 MG tablet Take 40 mg by mouth daily.     rosuvastatin (CRESTOR) 20 MG tablet Take by mouth. Take 1 tablet (20 mg total) by mouth once daily     sildenafil (REVATIO) 20 MG tablet MAY USE 1 TO 5 TABLETS DAILY 1/2 AN HOURBEFORE INTERCOURSE     sitaGLIPtin (JANUVIA) 100 MG tablet Take 100 mg by mouth daily.     testosterone cypionate (DEPOTESTOSTERONE CYPIONATE) 200 MG/ML injection INJECT 0.5 ML INTO THE MUSCLE EVERY 7 DAYS (SINGLE USE ONLY VIAL)     TRESIBA FLEXTOUCH 100 UNIT/ML FlexTouch Pen  INJECT 22 UNITS UNDER THE SKIN EVERY MORNING; TITRATE TO GET FASTING SUGAR BELOW 130 AS DIRECTED (MAX DAILY DOSE: 50 UNITS)     No current facility-administered medications for this visit.    Review of Systems  Constitutional:  Negative for appetite change, chills, fatigue, fever and unexpected weight change.  HENT:   Negative for hearing loss and voice change.   Eyes:  Negative for eye problems and icterus.  Respiratory:  Negative for chest tightness, cough and shortness of breath.   Cardiovascular:  Negative for chest  pain and leg swelling.  Gastrointestinal:  Negative for abdominal distention and abdominal pain.  Endocrine: Negative for hot flashes.  Genitourinary:  Negative for difficulty urinating, dysuria and frequency.   Musculoskeletal:  Negative for arthralgias.  Skin:  Negative for itching and rash.  Neurological:  Negative for light-headedness and numbness.  Hematological:  Negative for adenopathy. Does not bruise/bleed easily.  Psychiatric/Behavioral:  Negative for confusion.    PHYSICAL EXAMINATION: ECOG PERFORMANCE STATUS: 0 - Asymptomatic Vitals:   07/05/23 1313  BP: 125/71  Pulse: 79  Resp: 18  Temp: 98.1 F (36.7 C)   Filed Weights   07/05/23 1313  Weight: 144 lb 12.8 oz (65.7 kg)    Physical Exam Constitutional:      General: He is not in acute distress. HENT:     Head: Normocephalic and atraumatic.  Eyes:     General: No scleral icterus. Cardiovascular:     Rate and Rhythm: Normal rate and regular rhythm.  Pulmonary:     Effort: Pulmonary effort is normal. No respiratory distress.  Abdominal:     General: There is no distension.  Musculoskeletal:        General: No deformity. Normal range of motion.     Cervical back: Normal range of motion and neck supple.  Skin:    General: Skin is warm and dry.     Findings: No erythema or rash.  Neurological:     Mental Status: He is alert and oriented to person, place, and time. Mental status is at  baseline.     Cranial Nerves: No cranial nerve deficit.  Psychiatric:        Mood and Affect: Mood normal.     LABORATORY DATA:  I have reviewed the data as listed    Latest Ref Rng & Units 07/05/2023   12:43 PM 12/13/2022   10:07 AM  CBC  WBC 4.0 - 10.5 K/uL 9.9  8.1   Hemoglobin 13.0 - 17.0 g/dL 16.1  09.6   Hematocrit 39.0 - 52.0 % 53.2  51.5   Platelets 150 - 400 K/uL 180  168        No data to display            RADIOGRAPHIC STUDIES: I have personally reviewed the radiological images as listed and agreed with the findings in the report. No results found.

## 2023-07-05 NOTE — Assessment & Plan Note (Addendum)
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   Previous workup showed JAK2 V617F mutation negative, with reflex to other mutations CALR, MPL, JAK 2 Ex 12-15 mutations negative. Negative BCR-ABL1 FISH, less likely primary etiology. Today's labs showed hematocrit above 50.  Recommend phlebotomy 500 cc x 1 today repeat another phlebotomy session next week.

## 2023-07-08 ENCOUNTER — Encounter (INDEPENDENT_AMBULATORY_CARE_PROVIDER_SITE_OTHER): Payer: Self-pay

## 2023-07-08 ENCOUNTER — Encounter (INDEPENDENT_AMBULATORY_CARE_PROVIDER_SITE_OTHER): Payer: Self-pay | Admitting: Nurse Practitioner

## 2023-07-12 ENCOUNTER — Inpatient Hospital Stay: Payer: Medicare PPO

## 2023-07-12 VITALS — BP 130/85 | HR 72 | Resp 20

## 2023-07-12 DIAGNOSIS — D751 Secondary polycythemia: Secondary | ICD-10-CM | POA: Diagnosis not present

## 2023-07-12 NOTE — Patient Instructions (Signed)

## 2023-07-14 LAB — VAS US ABI WITH/WO TBI
Left ABI: 1.14
Right ABI: 1.28

## 2023-07-26 ENCOUNTER — Telehealth: Payer: Self-pay

## 2023-07-26 NOTE — Telephone Encounter (Signed)
The patient is scheduled to see Debbe Odea, MD on 08-03-2023 as a new patient. Called and left a voice message to inquire about any previous cardiac history. Requested the patient to call back at their earliest convenience.

## 2023-08-03 ENCOUNTER — Ambulatory Visit: Payer: Medicare PPO | Admitting: Cardiology

## 2023-10-11 ENCOUNTER — Encounter: Payer: Self-pay | Admitting: Oncology

## 2023-10-11 ENCOUNTER — Inpatient Hospital Stay: Payer: Medicare PPO

## 2023-10-11 ENCOUNTER — Inpatient Hospital Stay: Payer: Medicare PPO | Attending: Oncology

## 2023-10-11 ENCOUNTER — Inpatient Hospital Stay (HOSPITAL_BASED_OUTPATIENT_CLINIC_OR_DEPARTMENT_OTHER): Payer: Medicare PPO | Admitting: Oncology

## 2023-10-11 VITALS — BP 132/81 | HR 74 | Temp 96.6°F | Resp 18 | Wt 149.3 lb

## 2023-10-11 DIAGNOSIS — Z803 Family history of malignant neoplasm of breast: Secondary | ICD-10-CM | POA: Insufficient documentation

## 2023-10-11 DIAGNOSIS — D751 Secondary polycythemia: Secondary | ICD-10-CM | POA: Diagnosis not present

## 2023-10-11 DIAGNOSIS — Z79899 Other long term (current) drug therapy: Secondary | ICD-10-CM | POA: Insufficient documentation

## 2023-10-11 LAB — CBC WITH DIFFERENTIAL (CANCER CENTER ONLY)
Abs Immature Granulocytes: 0.02 10*3/uL (ref 0.00–0.07)
Basophils Absolute: 0.1 10*3/uL (ref 0.0–0.1)
Basophils Relative: 1 %
Eosinophils Absolute: 0.1 10*3/uL (ref 0.0–0.5)
Eosinophils Relative: 1 %
HCT: 43.4 % (ref 39.0–52.0)
Hemoglobin: 13.3 g/dL (ref 13.0–17.0)
Immature Granulocytes: 0 %
Lymphocytes Relative: 22 %
Lymphs Abs: 1.7 10*3/uL (ref 0.7–4.0)
MCH: 25.2 pg — ABNORMAL LOW (ref 26.0–34.0)
MCHC: 30.6 g/dL (ref 30.0–36.0)
MCV: 82.4 fL (ref 80.0–100.0)
Monocytes Absolute: 0.8 10*3/uL (ref 0.1–1.0)
Monocytes Relative: 10 %
Neutro Abs: 5.1 10*3/uL (ref 1.7–7.7)
Neutrophils Relative %: 66 %
Platelet Count: 248 10*3/uL (ref 150–400)
RBC: 5.27 MIL/uL (ref 4.22–5.81)
RDW: 13.9 % (ref 11.5–15.5)
WBC Count: 7.8 10*3/uL (ref 4.0–10.5)
nRBC: 0 % (ref 0.0–0.2)

## 2023-10-11 NOTE — Progress Notes (Signed)
 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   Previous workup showed JAK2 V617F mutation negative, with reflex to other mutations CALR, MPL, JAK 2 Ex 12-15 mutations negative. Negative BCR-ABL1 FISH, less likely primary etiology. Today's labs showed hematocrit < 10. No need for phlebotomy.    Orders Placed This Encounter  Procedures   CBC with Differential (Cancer Center Only)    Standing Status:   Future    Expected Date:   04/09/2024    Expiration Date:   10/10/2024   Follow-up 6 months All questions were answered. The patient knows to call the clinic with any problems, questions or concerns.  Zelphia Cap, MD, PhD [facility] Hematology Oncology 10/11/2023   HISTORY OF PRESENTING ILLNESS:   Eddie Welch is a  68 y.o.  male with PMH listed below was seen in consultation at the request of  Eliverto Bette Hover, *  for evaluation of erythrocytosis.  11/15/2022, patient CBC done which showed a hemoglobin of 18.2, hematocrit 53.6.  Patient is on testosterone replacement therapy, managed by endocrinology Dr. Cherilyn.  With holding testosterone, patient had a repeat CBC done on 11/22/2022, and had a CBC of 16.7, hematocrit 50.5. Patient reports having being on testosterone for a long time.  He had sleep study done in the past and he does not have sleep apnea.  Denies any unintentional weight loss, night sweats, fever.  Patient is a former smoker.   INTERVAL HISTORY Eddie Welch is a 68 y.o. 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 Currently he is on chronic testosterone replacement - he administrate about 45ml [90mg ] per week, in 3 divided  dosage.  No new complaints.     MEDICAL HISTORY:  Past Medical History:  Diagnosis Date   DM2 (diabetes mellitus, type 2) (HCC)    Generalized anxiety disorder    GERD (gastroesophageal reflux disease)    Hepatitis C    Hypogonadism male    Mixed hyperlipidemia    Seasonal allergies    Vasculitis on skin biopsy     SURGICAL HISTORY: Past Surgical History:  Procedure Laterality Date   ESOPHAGOGASTRODUODENOSCOPY     LIVER BIOPSY      SOCIAL HISTORY: Social History   Socioeconomic History   Marital status: Married    Spouse name: Not on file   Number of children: Not on file   Years of education: Not on file   Highest education level: Not on file  Occupational History   Not on file  Tobacco Use   Smoking status: Former    Current packs/day: 0.00    Average packs/day: 1 pack/day for 15.0 years (15.0 ttl pk-yrs)    Types: Cigarettes    Start date: 2004    Quit date: 2019    Years since quitting: 6.0   Smokeless tobacco: Never  Vaping Use   Vaping status: Never Used  Substance and Sexual Activity   Alcohol use: Not Currently    Comment: once in a while   Drug use: Not on file   Sexual activity: Not on file  Other Topics Concern   Not on file  Social History Narrative   Not on file   Social Drivers of  Health   Financial Resource Strain: Low Risk  (11/10/2022)   [facility], [facility]   Overall Financial Resource Strain (CARDIA)    Difficulty of Paying Living Expenses: Not very hard  Food Insecurity: No Food Insecurity (12/13/2022)   Hunger Vital Sign    Worried About Running Out of Food in the Last Year: Never true    Ran Out of Food in the Last Year: Never true  Transportation Needs: No Transportation Needs (12/13/2022)   PRAPARE - Administrator, Civil Service (Medical): No    Lack of Transportation (Non-Medical): No  Physical Activity: Not on file  Stress: Not on file  Social Connections:  Not on file  Intimate Partner Violence: Not At Risk (12/13/2022)   Humiliation, Afraid, Rape, and Kick questionnaire    Fear of Current or Ex-Partner: No    Emotionally Abused: No    Physically Abused: No    Sexually Abused: No    FAMILY HISTORY: Family History  Problem Relation Age of Onset   Hypertension Mother    Breast cancer Mother    Diabetes Father    CAD Father    Heart attack Father    Thyroid disease Father    Cancer Brother    Bipolar disorder Brother    Alcohol abuse Brother    Stroke Maternal Grandfather    Diabetes Paternal Grandmother    Prostate cancer Paternal Grandfather    Kidney cancer Paternal Grandfather     ALLERGIES:  has no known allergies.  MEDICATIONS:  Current Outpatient Medications  Medication Sig Dispense Refill   buPROPion (WELLBUTRIN XL) 150 MG 24 hr tablet Take 150 mg by mouth daily.     Dulaglutide 3 MG/0.5ML SOPN Inject into the skin. Inject 0.5 mLs (3 mg total) subcutaneously once a week     empagliflozin (JARDIANCE) 25 MG TABS tablet Take 25 mg by mouth daily.     Insulin Aspart FlexPen (NOVOLOG) 100 UNIT/ML 3 units with meals that have carb. Add sliding scale as necessary. Max daily dose 50 units.     lisinopril (ZESTRIL) 5 MG tablet Take 5 mg by mouth daily.     Multiple Vitamin (MULTIVITAMIN) capsule Take 1 capsule by mouth daily.     pantoprazole (PROTONIX) 40 MG tablet Take 40 mg by mouth daily.     rosuvastatin (CRESTOR) 20 MG tablet Take by mouth. Take 1 tablet (20 mg total) by mouth once daily     sildenafil (REVATIO) 20 MG tablet MAY USE 1 TO 5 TABLETS DAILY 1/2 AN HOURBEFORE INTERCOURSE     sitaGLIPtin (JANUVIA) 100 MG tablet Take 100 mg by mouth daily.     testosterone cypionate (DEPOTESTOSTERONE CYPIONATE) 200 MG/ML injection INJECT 0.5 ML INTO THE MUSCLE EVERY 7 DAYS (SINGLE USE ONLY VIAL)     TRESIBA FLEXTOUCH 100 UNIT/ML FlexTouch Pen INJECT 22 UNITS UNDER THE SKIN EVERY MORNING; TITRATE TO GET FASTING SUGAR BELOW 130 AS  DIRECTED (MAX DAILY DOSE: 50 UNITS)     No current facility-administered medications for this visit.    Review of Systems  Constitutional:  Negative for appetite change, chills, fatigue, fever and unexpected weight change.  HENT:   Negative for hearing loss and voice change.   Eyes:  Negative for eye problems and icterus.  Respiratory:  Negative for chest tightness, cough and shortness of breath.   Cardiovascular:  Negative for chest pain and leg swelling.  Gastrointestinal:  Negative for abdominal distention and  abdominal pain.  Endocrine: Negative for hot flashes.  Genitourinary:  Negative for difficulty urinating, dysuria and frequency.   Musculoskeletal:  Negative for arthralgias.  Skin:  Negative for itching and rash.  Neurological:  Negative for light-headedness and numbness.  Hematological:  Negative for adenopathy. Does not bruise/bleed easily.  Psychiatric/Behavioral:  Negative for confusion.    PHYSICAL EXAMINATION: ECOG PERFORMANCE STATUS: 0 - Asymptomatic Vitals:   10/11/23 1323  BP: 132/81  Pulse: 74  Resp: 18  Temp: (!) 96.6 F (35.9 C)  SpO2: 99%   Filed Weights   10/11/23 1323  Weight: 149 lb 4.8 oz (67.7 kg)    Physical Exam Constitutional:      General: He is not in acute distress. HENT:     Head: Normocephalic and atraumatic.  Eyes:     General: No scleral icterus. Cardiovascular:     Rate and Rhythm: Normal rate.  Pulmonary:     Effort: Pulmonary effort is normal. No respiratory distress.  Abdominal:     General: There is no distension.  Musculoskeletal:        General: Normal range of motion.     Cervical back: Normal range of motion and neck supple.  Skin:    Findings: No rash.  Neurological:     Mental Status: He is alert and oriented to person, place, and time. Mental status is at baseline.  Psychiatric:        Mood and Affect: Mood normal.     LABORATORY DATA:  I have reviewed the data as listed    Latest Ref Rng & Units  10/11/2023    1:16 PM 07/05/2023   12:43 PM 12/13/2022   10:07 AM  CBC  WBC 4.0 - 10.5 K/uL 7.8  9.9  8.1   Hemoglobin 13.0 - 17.0 g/dL 86.6  82.0  83.0   Hematocrit 39.0 - 52.0 % 43.4  53.2  51.5   Platelets 150 - 400 K/uL 248  180  168        No data to display            RADIOGRAPHIC STUDIES: I have personally reviewed the radiological images as listed and agreed with the findings in the report. No results found.

## 2023-10-11 NOTE — Assessment & Plan Note (Addendum)
 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   Previous workup showed JAK2 V617F mutation negative, with reflex to other mutations CALR, MPL, JAK 2 Ex 12-15 mutations negative. Negative BCR-ABL1 FISH, less likely primary etiology. Today's labs showed hematocrit < 10. No need for phlebotomy.

## 2024-04-10 ENCOUNTER — Inpatient Hospital Stay: Payer: Medicare PPO

## 2024-04-10 ENCOUNTER — Inpatient Hospital Stay: Payer: Medicare PPO | Attending: Oncology

## 2024-04-10 ENCOUNTER — Inpatient Hospital Stay: Payer: Medicare PPO | Admitting: Oncology

## 2024-04-10 ENCOUNTER — Encounter: Payer: Self-pay | Admitting: Oncology

## 2024-05-11 ENCOUNTER — Telehealth: Payer: Self-pay | Admitting: Oncology

## 2024-05-11 NOTE — Telephone Encounter (Signed)
 Pt called and requested that we r/s his 8/19 appts due to PCP saying that he could push those appts out by 3 months - Eddie Welch

## 2024-05-15 ENCOUNTER — Inpatient Hospital Stay: Admitting: Oncology

## 2024-05-15 ENCOUNTER — Inpatient Hospital Stay

## 2024-08-13 ENCOUNTER — Inpatient Hospital Stay: Admitting: Oncology

## 2024-08-13 ENCOUNTER — Inpatient Hospital Stay: Attending: Oncology

## 2024-08-13 ENCOUNTER — Encounter: Payer: Self-pay | Admitting: Oncology

## 2024-08-13 ENCOUNTER — Inpatient Hospital Stay

## 2024-08-13 VITALS — BP 133/74 | HR 73 | Temp 97.0°F | Resp 18 | Wt 144.7 lb

## 2024-08-13 DIAGNOSIS — Z8051 Family history of malignant neoplasm of kidney: Secondary | ICD-10-CM | POA: Insufficient documentation

## 2024-08-13 DIAGNOSIS — D751 Secondary polycythemia: Secondary | ICD-10-CM

## 2024-08-13 DIAGNOSIS — Z8042 Family history of malignant neoplasm of prostate: Secondary | ICD-10-CM | POA: Insufficient documentation

## 2024-08-13 DIAGNOSIS — Z87891 Personal history of nicotine dependence: Secondary | ICD-10-CM | POA: Insufficient documentation

## 2024-08-13 DIAGNOSIS — Z79899 Other long term (current) drug therapy: Secondary | ICD-10-CM | POA: Insufficient documentation

## 2024-08-13 DIAGNOSIS — Z803 Family history of malignant neoplasm of breast: Secondary | ICD-10-CM | POA: Insufficient documentation

## 2024-08-13 LAB — CBC WITH DIFFERENTIAL (CANCER CENTER ONLY)
Abs Immature Granulocytes: 0.03 K/uL (ref 0.00–0.07)
Basophils Absolute: 0.1 K/uL (ref 0.0–0.1)
Basophils Relative: 1 %
Eosinophils Absolute: 0.1 K/uL (ref 0.0–0.5)
Eosinophils Relative: 1 %
HCT: 46.2 % (ref 39.0–52.0)
Hemoglobin: 14.8 g/dL (ref 13.0–17.0)
Immature Granulocytes: 0 %
Lymphocytes Relative: 21 %
Lymphs Abs: 1.7 K/uL (ref 0.7–4.0)
MCH: 25.9 pg — ABNORMAL LOW (ref 26.0–34.0)
MCHC: 32 g/dL (ref 30.0–36.0)
MCV: 80.9 fL (ref 80.0–100.0)
Monocytes Absolute: 0.6 K/uL (ref 0.1–1.0)
Monocytes Relative: 8 %
Neutro Abs: 5.6 K/uL (ref 1.7–7.7)
Neutrophils Relative %: 69 %
Platelet Count: 160 K/uL (ref 150–400)
RBC: 5.71 MIL/uL (ref 4.22–5.81)
RDW: 14.6 % (ref 11.5–15.5)
WBC Count: 8 K/uL (ref 4.0–10.5)
nRBC: 0 % (ref 0.0–0.2)

## 2024-08-13 NOTE — Assessment & Plan Note (Addendum)
 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   Previous workup showed JAK2 V617F mutation negative, with reflex to other mutations CALR, MPL, JAK 2 Ex 12-15 mutations negative. Negative BCR-ABL1 FISH, less likely primary etiology. Today's labs showed hematocrit < 50. No need for phlebotomy.  Hematocrit is normal likely due to patient not being on testosterone currently. He can follow-up as needed in the future if he resumed testosterone and develops erythrocytosis

## 2024-08-13 NOTE — Progress Notes (Signed)
 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   Previous workup showed JAK2 V617F mutation negative, with reflex to other mutations CALR, MPL, JAK 2 Ex 12-15 mutations negative. Negative BCR-ABL1 FISH, less likely primary etiology. Today's labs showed hematocrit < 50. No need for phlebotomy.  Hematocrit is normal likely due to patient not being on testosterone currently. He can follow-up as needed in the future if he resumed testosterone and develops erythrocytosis   No orders of the defined types were placed in this encounter.  Follow-up  PRN All questions were answered. The patient knows to call the clinic with any problems, questions or concerns.  Zelphia Cap, MD, PhD [facility] Hematology Oncology 08/13/2024   HISTORY OF PRESENTING ILLNESS:   Eddie Welch is a  68 y.o.  male with PMH listed below was seen in consultation at the request of  Eliverto Bette Hover, *  for evaluation of erythrocytosis.  11/15/2022, patient CBC done which showed a hemoglobin of 18.2, hematocrit 53.6.  Patient is on testosterone replacement therapy, managed by endocrinology Dr. Cherilyn.  With holding testosterone, patient had a repeat CBC done on 11/22/2022, and had a CBC of 16.7, hematocrit 50.5. Patient reports having being on testosterone for a long time.  He had sleep study done in the past and he does not have sleep apnea.  Denies any unintentional weight loss, night sweats, fever.  Patient is a former smoker.   INTERVAL HISTORY Eddie Welch is a 68 y.o. 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 Currently he is off chronic testosterone replacement - he  administrate about 45ml [90mg ] per week, in 3 divided dosage.  No new complaints.     MEDICAL HISTORY:  Past Medical History:  Diagnosis Date   DM2 (diabetes mellitus, type 2) (HCC)    Generalized anxiety disorder    GERD (gastroesophageal reflux disease)    Hepatitis C    Hypogonadism male    Mixed hyperlipidemia    Seasonal allergies    Vasculitis on skin biopsy     SURGICAL HISTORY: Past Surgical History:  Procedure Laterality Date   ESOPHAGOGASTRODUODENOSCOPY     LIVER BIOPSY      SOCIAL HISTORY: Social History   Socioeconomic History   Marital status: Married    Spouse name: Not on file   Number of children: Not on file   Years of education: Not on file   Highest education level: Not on file  Occupational History   Not on file  Tobacco Use   Smoking status: Former    Current packs/day: 0.00    Average packs/day: 1 pack/day for 15.0 years (15.0 ttl pk-yrs)    Types: Cigarettes    Start date: 2004    Quit date: 2019    Years since quitting: 6.8   Smokeless tobacco: Never  Vaping Use   Vaping status: Never Used  Substance and Sexual Activity   Alcohol use: Not Currently    Comment: once in a while   Drug use: Not on file   Sexual activity: Not on file  Other Topics Concern   Not on file  Social History Narrative   Not on file   Social Drivers of Health  Financial Resource Strain: Low Risk  (11/10/2022)   Received from [facility]   Overall Financial Resource Strain (CARDIA)    Difficulty of Paying Living Expenses: Not very hard  Food Insecurity: No Food Insecurity (12/13/2022)   Hunger Vital Sign    Worried About Running Out of Food in the Last Year: Never true    Ran Out of Food in the Last Year: Never true  Transportation Needs: No Transportation Needs (12/13/2022)   PRAPARE - Administrator, Civil Service (Medical): No    Lack of Transportation (Non-Medical): No  Physical Activity: Not on file  Stress: Not on file   Social Connections: Not on file  Intimate Partner Violence: Not At Risk (12/13/2022)   Humiliation, Afraid, Rape, and Kick questionnaire    Fear of Current or Ex-Partner: No    Emotionally Abused: No    Physically Abused: No    Sexually Abused: No    FAMILY HISTORY: Family History  Problem Relation Age of Onset   Hypertension Mother    Breast cancer Mother    Diabetes Father    CAD Father    Heart attack Father    Thyroid disease Father    Cancer Brother    Bipolar disorder Brother    Alcohol abuse Brother    Stroke Maternal Grandfather    Diabetes Paternal Grandmother    Prostate cancer Paternal Grandfather    Kidney cancer Paternal Grandfather     ALLERGIES:  has no known allergies.  MEDICATIONS:  Current Outpatient Medications  Medication Sig Dispense Refill   buPROPion (WELLBUTRIN XL) 150 MG 24 hr tablet Take 150 mg by mouth daily.     Dulaglutide 3 MG/0.5ML SOPN Inject into the skin. Inject 0.5 mLs (3 mg total) subcutaneously once a week     empagliflozin (JARDIANCE) 25 MG TABS tablet Take 25 mg by mouth daily.     Insulin Aspart FlexPen (NOVOLOG) 100 UNIT/ML 3 units with meals that have carb. Add sliding scale as necessary. Max daily dose 50 units.     lisinopril (ZESTRIL) 5 MG tablet Take 5 mg by mouth daily.     Multiple Vitamin (MULTIVITAMIN) capsule Take 1 capsule by mouth daily.     pantoprazole (PROTONIX) 40 MG tablet Take 40 mg by mouth daily.     rosuvastatin (CRESTOR) 20 MG tablet Take by mouth. Take 1 tablet (20 mg total) by mouth once daily     sildenafil (REVATIO) 20 MG tablet MAY USE 1 TO 5 TABLETS DAILY 1/2 AN HOURBEFORE INTERCOURSE     testosterone cypionate (DEPOTESTOSTERONE CYPIONATE) 200 MG/ML injection INJECT 0.5 ML INTO THE MUSCLE EVERY 7 DAYS (SINGLE USE ONLY VIAL)     TRESIBA FLEXTOUCH 100 UNIT/ML FlexTouch Pen INJECT 22 UNITS UNDER THE SKIN EVERY MORNING; TITRATE TO GET FASTING SUGAR BELOW 130 AS DIRECTED (MAX DAILY DOSE: 50 UNITS)      sitaGLIPtin (JANUVIA) 100 MG tablet Take 100 mg by mouth daily. (Patient not taking: Reported on 08/13/2024)     No current facility-administered medications for this visit.    Review of Systems  Constitutional:  Negative for appetite change, chills, fatigue, fever and unexpected weight change.  HENT:   Negative for hearing loss and voice change.   Eyes:  Negative for eye problems and icterus.  Respiratory:  Negative for chest tightness, cough and shortness of breath.   Cardiovascular:  Negative for chest pain and leg swelling.  Gastrointestinal:  Negative for abdominal distention and abdominal  pain.  Endocrine: Negative for hot flashes.  Genitourinary:  Negative for difficulty urinating, dysuria and frequency.   Musculoskeletal:  Negative for arthralgias.  Skin:  Negative for itching and rash.  Neurological:  Negative for light-headedness and numbness.  Hematological:  Negative for adenopathy. Does not bruise/bleed easily.  Psychiatric/Behavioral:  Negative for confusion.    PHYSICAL EXAMINATION: ECOG PERFORMANCE STATUS: 0 - Asymptomatic Vitals:   08/13/24 1304 08/13/24 1312  BP: (!) 141/89 133/74  Pulse: 73   Resp: 18   Temp: (!) 97 F (36.1 C)   SpO2: 100%    Filed Weights   08/13/24 1304  Weight: 144 lb 11.2 oz (65.6 kg)    Physical Exam Constitutional:      General: He is not in acute distress. HENT:     Head: Normocephalic and atraumatic.  Eyes:     General: No scleral icterus. Cardiovascular:     Rate and Rhythm: Normal rate.  Pulmonary:     Effort: Pulmonary effort is normal. No respiratory distress.  Abdominal:     General: There is no distension.  Musculoskeletal:        General: Normal range of motion.     Cervical back: Normal range of motion and neck supple.  Skin:    Findings: No rash.  Neurological:     Mental Status: He is alert and oriented to person, place, and time. Mental status is at baseline.  Psychiatric:        Mood and Affect: Mood  normal.     LABORATORY DATA:  I have reviewed the data as listed    Latest Ref Rng & Units 08/13/2024   12:47 PM 10/11/2023    1:16 PM 07/05/2023   12:43 PM  CBC  WBC 4.0 - 10.5 K/uL 8.0  7.8  9.9   Hemoglobin 13.0 - 17.0 g/dL 85.1  86.6  82.0   Hematocrit 39.0 - 52.0 % 46.2  43.4  53.2   Platelets 150 - 400 K/uL 160  248  180        No data to display            RADIOGRAPHIC STUDIES: I have personally reviewed the radiological images as listed and agreed with the findings in the report. No results found.

## 2024-08-13 NOTE — Progress Notes (Signed)
No phlebotomy today.
